# Patient Record
Sex: Female | Born: 1985 | Race: Black or African American | Hispanic: No | Marital: Single | Smoking: Current every day smoker
Health system: Southern US, Community
[De-identification: ages and names within clinical notes are randomized; demographics above are authoritative.]

## PROBLEM LIST (undated history)

## (undated) ENCOUNTER — Emergency Department (HOSPITAL_COMMUNITY): Discharge status: Absent without leave | Payer: Self-pay

---

## 2005-03-23 ENCOUNTER — Emergency Department: Payer: Self-pay | Admitting: Unknown Physician Specialty

## 2005-12-12 ENCOUNTER — Other Ambulatory Visit: Payer: Self-pay

## 2005-12-12 ENCOUNTER — Emergency Department: Payer: Self-pay | Admitting: Emergency Medicine

## 2007-06-21 ENCOUNTER — Emergency Department: Payer: Self-pay | Admitting: General Practice

## 2007-06-25 ENCOUNTER — Emergency Department: Payer: Self-pay | Admitting: Emergency Medicine

## 2007-07-24 ENCOUNTER — Emergency Department: Payer: Self-pay

## 2007-09-15 ENCOUNTER — Emergency Department: Payer: Self-pay | Admitting: Emergency Medicine

## 2007-11-02 ENCOUNTER — Observation Stay: Payer: Self-pay | Admitting: Obstetrics and Gynecology

## 2007-12-10 ENCOUNTER — Observation Stay: Payer: Self-pay

## 2008-01-25 ENCOUNTER — Observation Stay: Payer: Self-pay | Admitting: Obstetrics and Gynecology

## 2008-02-03 ENCOUNTER — Observation Stay: Payer: Self-pay | Admitting: Certified Nurse Midwife

## 2008-02-10 ENCOUNTER — Observation Stay: Payer: Self-pay

## 2008-02-11 ENCOUNTER — Inpatient Hospital Stay: Payer: Self-pay | Admitting: Obstetrics and Gynecology

## 2010-01-23 ENCOUNTER — Emergency Department: Payer: Self-pay | Admitting: Emergency Medicine

## 2010-10-19 ENCOUNTER — Emergency Department: Payer: Self-pay | Admitting: Emergency Medicine

## 2011-06-20 ENCOUNTER — Emergency Department: Payer: Self-pay | Admitting: Emergency Medicine

## 2011-07-15 ENCOUNTER — Emergency Department: Payer: Self-pay | Admitting: Emergency Medicine
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
## 2012-11-08 ENCOUNTER — Emergency Department: Payer: Self-pay | Admitting: Emergency Medicine

## 2012-12-26 LAB — URINALYSIS, COMPLETE
Glucose,UR: NEGATIVE mg/dL (ref 0–75)
Nitrite: NEGATIVE
Ph: 5 (ref 4.5–8.0)
Protein: 100
RBC,UR: 3 /HPF (ref 0–5)
WBC UR: 13 /HPF (ref 0–5)

## 2012-12-26 LAB — COMPREHENSIVE METABOLIC PANEL
Albumin: 4.5 g/dL (ref 3.4–5.0)
Alkaline Phosphatase: 102 U/L (ref 50–136)
Calcium, Total: 9.4 mg/dL (ref 8.5–10.1)
Creatinine: 0.79 mg/dL (ref 0.60–1.30)
EGFR (Non-African Amer.): >60
Glucose: 123 mg/dL — ABNORMAL HIGH (ref 65–99)
SGOT(AST): 17 U/L (ref 15–37)
SGPT (ALT): 16 U/L (ref 12–78)
Sodium: 136 mmol/L (ref 136–145)

## 2012-12-26 LAB — CBC
HCT: 43.7 % (ref 35.0–47.0)
HGB: 14.6 g/dL (ref 12.0–16.0)
MCH: 28.9 pg (ref 26.0–34.0)
RBC: 5.04 10*6/uL (ref 3.80–5.20)

## 2012-12-27 ENCOUNTER — Inpatient Hospital Stay: Payer: Self-pay

## 2012-12-27 LAB — WET PREP, GENITAL

## 2012-12-28 LAB — CBC WITH DIFFERENTIAL/PLATELET
Basophil #: 0 10*3/uL (ref 0.0–0.1)
Basophil %: 0.1 %
Eosinophil #: 0.1 10*3/uL (ref 0.0–0.7)
HGB: 11.7 g/dL — ABNORMAL LOW (ref 12.0–16.0)
Lymphocyte #: 0.6 10*3/uL — ABNORMAL LOW (ref 1.0–3.6)
Lymphocyte %: 8.1 %
MCH: 30 pg (ref 26.0–34.0)
MCHC: 34.3 g/dL (ref 32.0–36.0)
MCV: 87 fL (ref 80–100)
Monocyte #: 0.6 x10 3/mm (ref 0.2–0.9)
Neutrophil #: 6.2 10*3/uL (ref 1.4–6.5)
Neutrophil %: 82.9 %

## 2012-12-28 LAB — POTASSIUM: Potassium: 3 mmol/L — ABNORMAL LOW (ref 3.5–5.1)

## 2013-10-23 ENCOUNTER — Emergency Department: Payer: Self-pay | Admitting: Emergency Medicine

## 2014-03-05 ENCOUNTER — Emergency Department: Payer: Self-pay | Admitting: Emergency Medicine

## 2014-03-05 LAB — URINALYSIS, COMPLETE
BACTERIA: NONE SEEN
Bilirubin,UR: NEGATIVE
Glucose,UR: NEGATIVE mg/dL (ref 0–75)
Nitrite: NEGATIVE
Ph: 5 (ref 4.5–8.0)
Protein: 30
Specific Gravity: 1.025 (ref 1.003–1.030)
WBC UR: 7 /HPF (ref 0–5)

## 2014-03-05 LAB — COMPREHENSIVE METABOLIC PANEL
ANION GAP: 6 — AB (ref 7–16)
AST: 23 U/L (ref 15–37)
Albumin: 4.3 g/dL (ref 3.4–5.0)
Alkaline Phosphatase: 81 U/L
BUN: 8 mg/dL (ref 7–18)
Bilirubin,Total: 0.5 mg/dL (ref 0.2–1.0)
CHLORIDE: 104 mmol/L (ref 98–107)
Calcium, Total: 9.4 mg/dL (ref 8.5–10.1)
Co2: 26 mmol/L (ref 21–32)
Creatinine: 0.72 mg/dL (ref 0.60–1.30)
EGFR (African American): >60
EGFR (Non-African Amer.): >60
Glucose: 86 mg/dL (ref 65–99)
OSMOLALITY: 270 (ref 275–301)
Potassium: 3.6 mmol/L (ref 3.5–5.1)
SGPT (ALT): 39 U/L (ref 12–78)
Sodium: 136 mmol/L (ref 136–145)
TOTAL PROTEIN: 9.1 g/dL — AB (ref 6.4–8.2)

## 2014-03-05 LAB — CBC WITH DIFFERENTIAL/PLATELET
Basophil #: 0 10*3/uL (ref 0.0–0.1)
Basophil %: 0.6 %
EOS PCT: 0.5 %
Eosinophil #: 0 10*3/uL (ref 0.0–0.7)
HCT: 45.8 % (ref 35.0–47.0)
HGB: 15.3 g/dL (ref 12.0–16.0)
LYMPHS ABS: 1.1 10*3/uL (ref 1.0–3.6)
LYMPHS PCT: 17.4 %
MCH: 29.3 pg (ref 26.0–34.0)
MCHC: 33.5 g/dL (ref 32.0–36.0)
MCV: 87 fL (ref 80–100)
MONO ABS: 0.2 x10 3/mm (ref 0.2–0.9)
MONOS PCT: 3.2 %
NEUTROS ABS: 5.1 10*3/uL (ref 1.4–6.5)
NEUTROS PCT: 78.3 %
PLATELETS: 175 10*3/uL (ref 150–440)
RBC: 5.24 10*6/uL — AB (ref 3.80–5.20)
RDW: 13.6 % (ref 11.5–14.5)
WBC: 6.5 10*3/uL (ref 3.6–11.0)

## 2014-07-16 ENCOUNTER — Emergency Department: Payer: Self-pay | Admitting: Internal Medicine

## 2014-07-16 LAB — COMPREHENSIVE METABOLIC PANEL
Albumin: 3.8 g/dL (ref 3.4–5.0)
Alkaline Phosphatase: 52 U/L
Anion Gap: 7 (ref 7–16)
BUN: 7 mg/dL (ref 7–18)
Bilirubin,Total: 0.3 mg/dL (ref 0.2–1.0)
CHLORIDE: 105 mmol/L (ref 98–107)
Calcium, Total: 8.4 mg/dL — ABNORMAL LOW (ref 8.5–10.1)
Co2: 26 mmol/L (ref 21–32)
Creatinine: 0.85 mg/dL (ref 0.60–1.30)
EGFR (African American): >60
GLUCOSE: 108 mg/dL — AB (ref 65–99)
Osmolality: 274 (ref 275–301)
Potassium: 3.2 mmol/L — ABNORMAL LOW (ref 3.5–5.1)
SGOT(AST): 21 U/L (ref 15–37)
SGPT (ALT): 17 U/L (ref 12–78)
Sodium: 138 mmol/L (ref 136–145)
Total Protein: 7.9 g/dL (ref 6.4–8.2)

## 2014-07-16 LAB — CBC WITH DIFFERENTIAL/PLATELET
BASOS ABS: 0.1 10*3/uL (ref 0.0–0.1)
Basophil %: 0.8 %
EOS ABS: 0.1 10*3/uL (ref 0.0–0.7)
Eosinophil %: 0.9 %
HCT: 39.2 % (ref 35.0–47.0)
HGB: 12.9 g/dL (ref 12.0–16.0)
LYMPHS ABS: 2.1 10*3/uL (ref 1.0–3.6)
Lymphocyte %: 24.3 %
MCH: 29.1 pg (ref 26.0–34.0)
MCHC: 32.9 g/dL (ref 32.0–36.0)
MCV: 88 fL (ref 80–100)
MONOS PCT: 4.7 %
Monocyte #: 0.4 x10 3/mm (ref 0.2–0.9)
NEUTROS PCT: 69.3 %
Neutrophil #: 5.9 10*3/uL (ref 1.4–6.5)
Platelet: 212 10*3/uL (ref 150–440)
RBC: 4.44 10*6/uL (ref 3.80–5.20)
RDW: 13.4 % (ref 11.5–14.5)
WBC: 8.5 10*3/uL (ref 3.6–11.0)

## 2014-07-16 LAB — LIPASE, BLOOD: Lipase: 330 U/L (ref 73–393)

## 2014-07-16 LAB — URINALYSIS, COMPLETE
BILIRUBIN, UR: NEGATIVE
Bacteria: NONE SEEN
Blood: NEGATIVE
Glucose,UR: NEGATIVE mg/dL (ref 0–75)
Nitrite: NEGATIVE
Ph: 5 (ref 4.5–8.0)
Protein: NEGATIVE
RBC,UR: 2 /HPF (ref 0–5)
Specific Gravity: 1.014 (ref 1.003–1.030)
Squamous Epithelial: <1

## 2014-07-16 LAB — HCG, QUANTITATIVE, PREGNANCY: Beta Hcg, Quant.: <1 m[IU]/mL — ABNORMAL LOW

## 2014-11-15 IMAGING — CR CERVICAL SPINE - 2-3 VIEW
1 series · 6 of 6 positions shown · non-contrast
Comparison: none

REASON FOR EXAM: fall, neck pain
COMMENTS:

PROCEDURE:     DXR - DXR C- SPINE AP AND LATERAL  - October 23, 2013  [DATE]
RESULT:     Technique: Cervical spine series was obtained.

[Series 1: w cervical spine lat · 0.14mm/px · 6 of 6 slices shown]
[im 1/6]
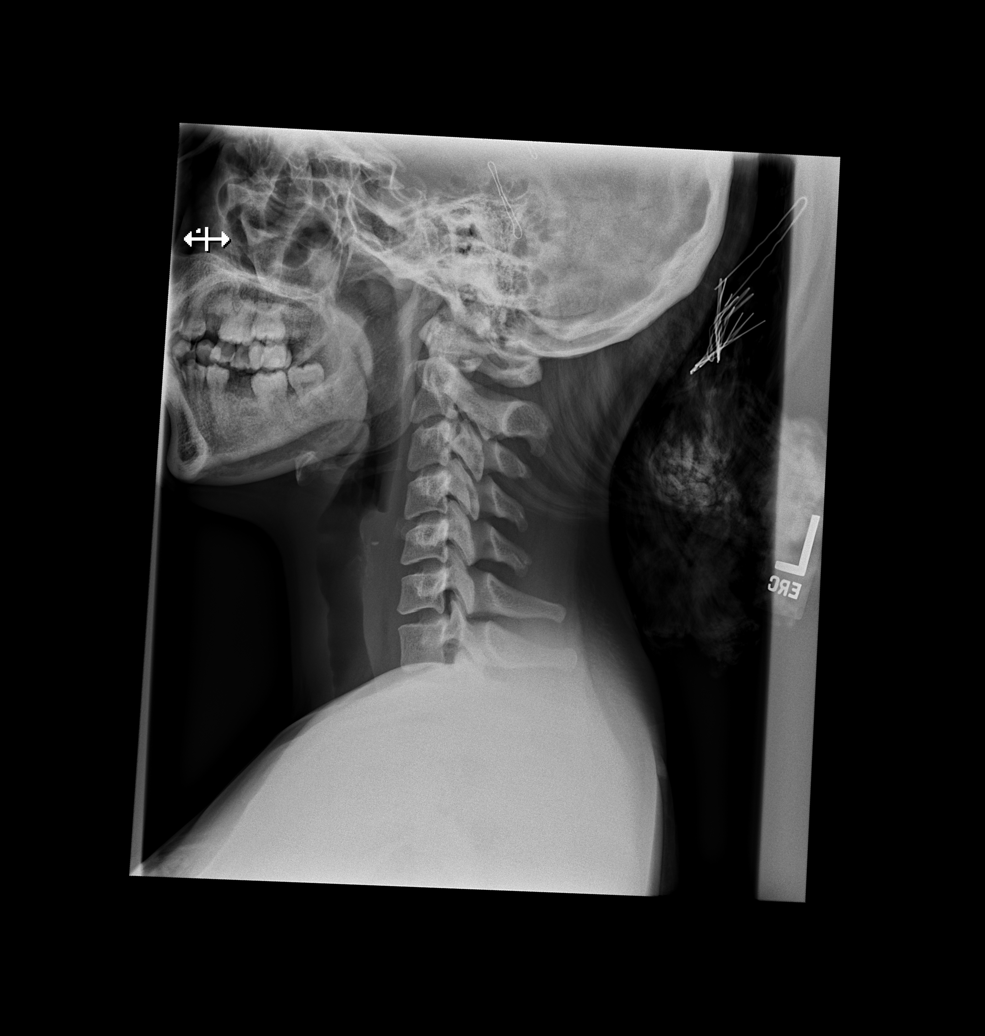
[im 2/6]
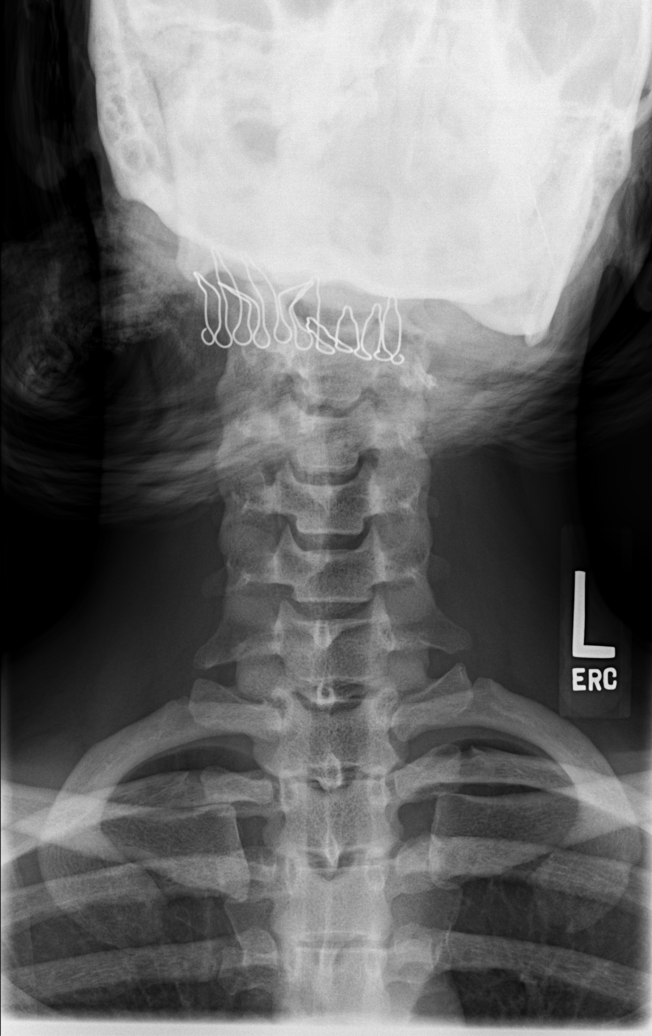
[im 3/6]
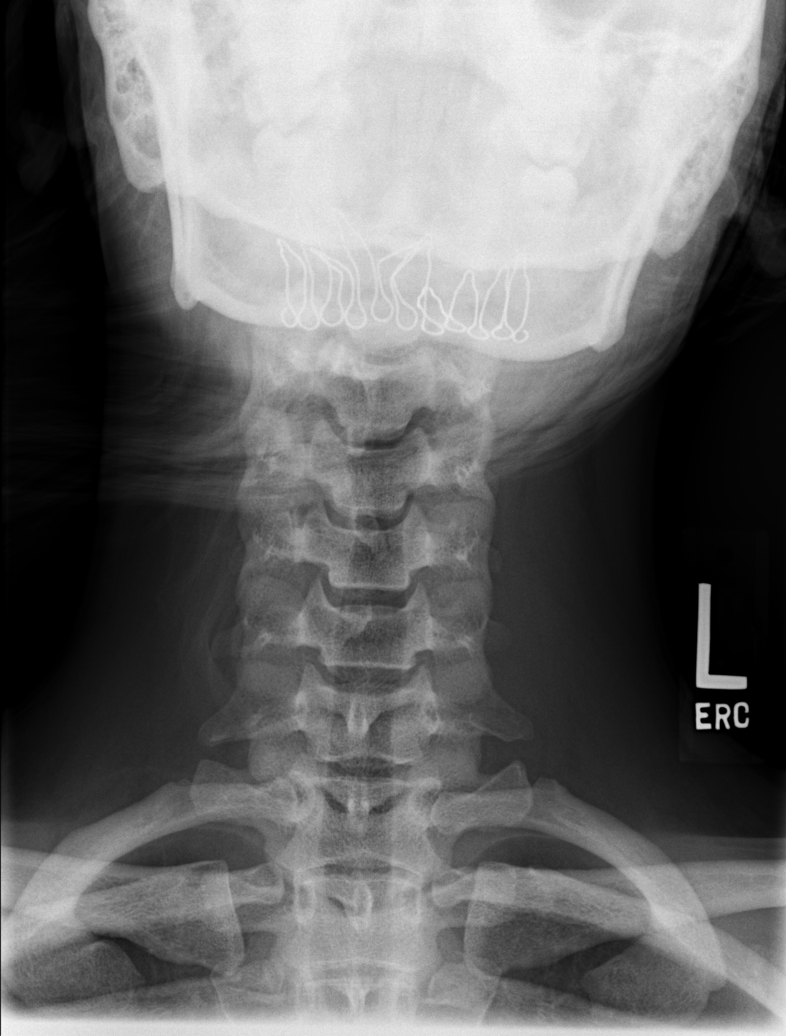
[im 4/6]
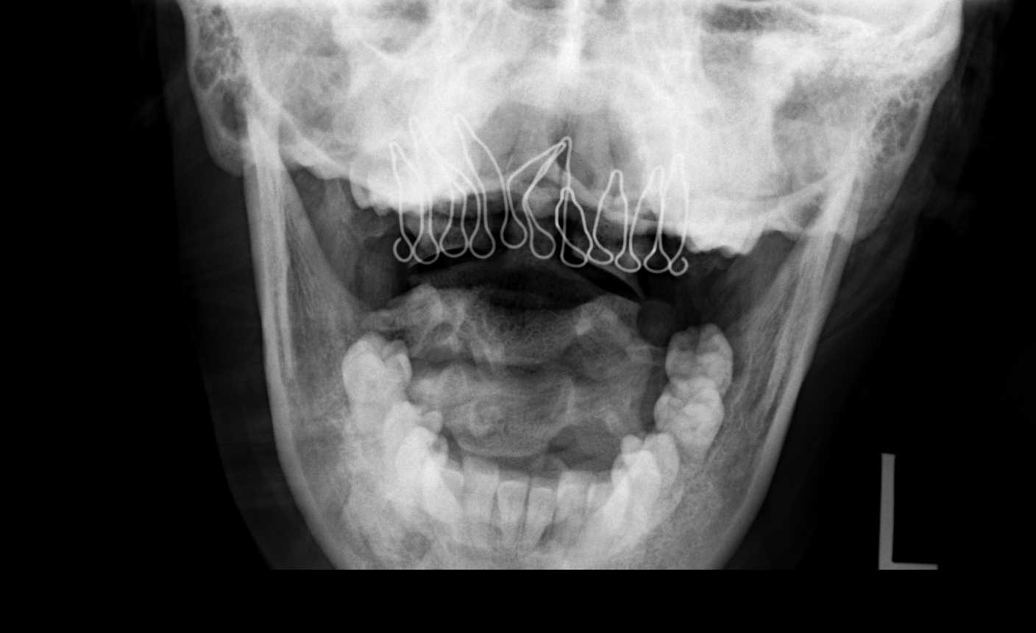
[im 5/6]
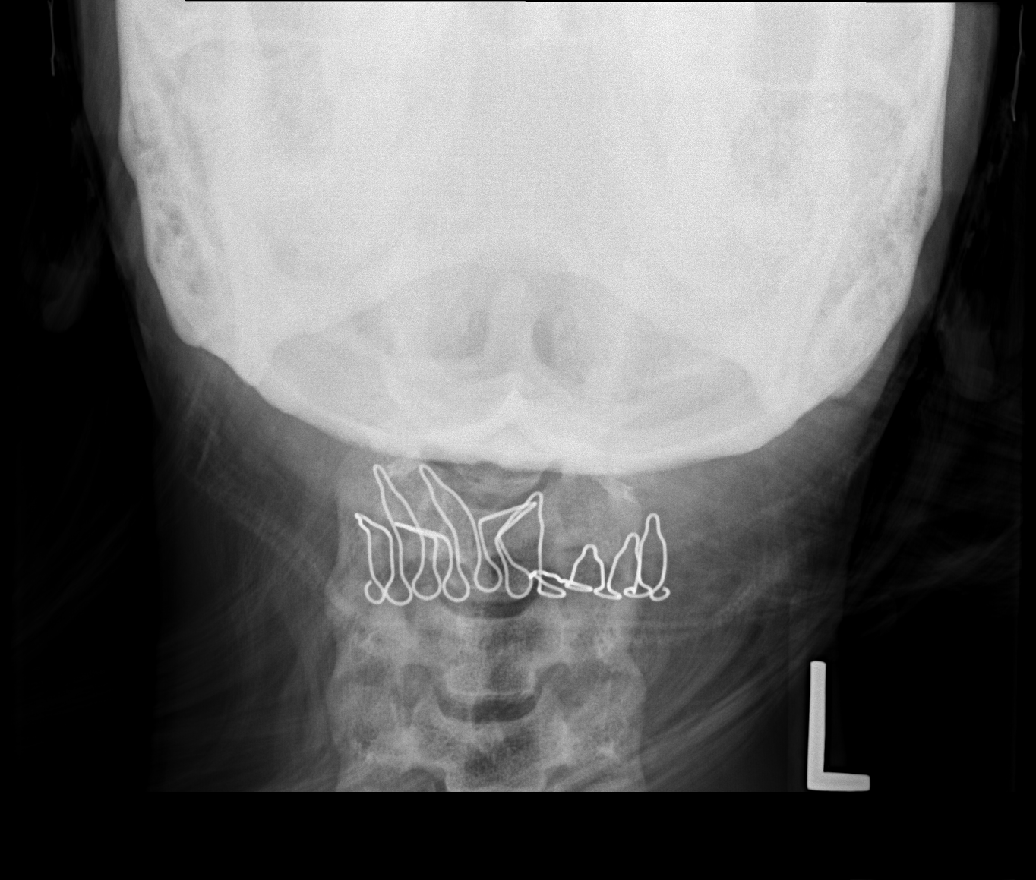
[im 6/6]
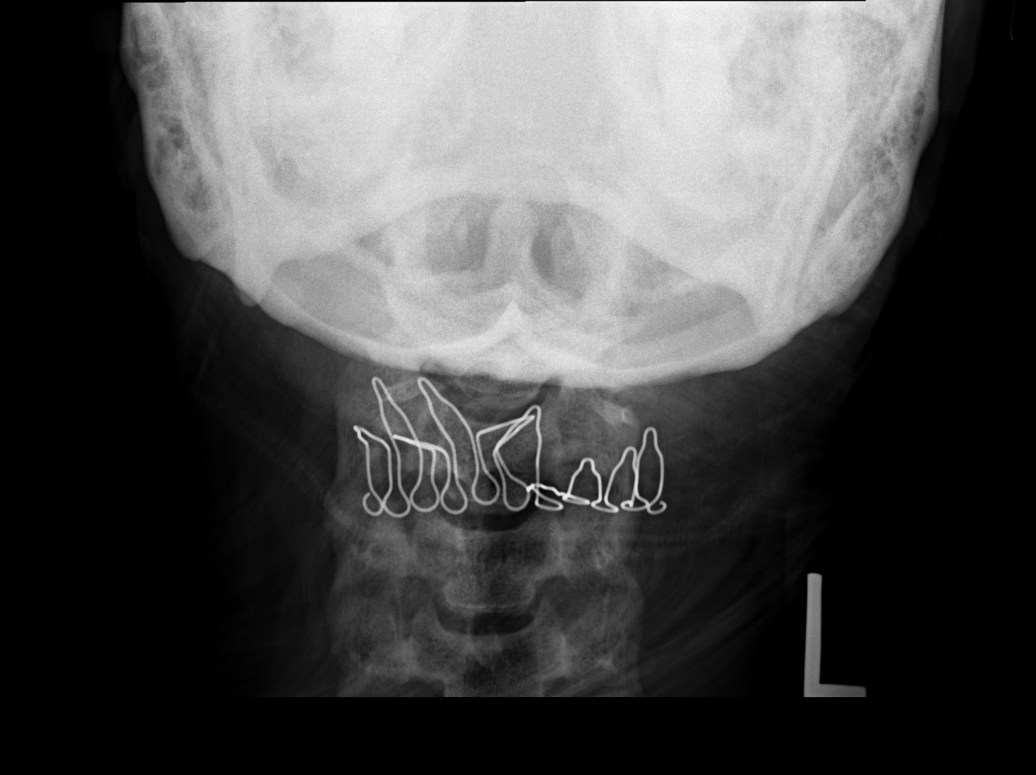

[6 of 6 positions shown; findings below may reference images not displayed]

FINDINGS: There is no evidence of fracture, dislocation nor malalignment.
There is no evidence of prevertebral soft tissue swelling.
IMPRESSION: No evidence of acute osseous abnormalities.
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
## 2023-10-12 ENCOUNTER — Other Ambulatory Visit: Payer: Self-pay

## 2023-10-12 ENCOUNTER — Emergency Department
Admission: EM | Admit: 2023-10-12 | Discharge: 2023-10-12 | Disposition: A | Discharge status: Home / Self Care | Payer: 59 | Attending: Emergency Medicine | Admitting: Emergency Medicine

## 2023-10-12 DIAGNOSIS — I10 Essential (primary) hypertension: Secondary | ICD-10-CM | POA: Insufficient documentation

## 2023-10-12 DIAGNOSIS — R1110 Vomiting, unspecified: Secondary | ICD-10-CM | POA: Insufficient documentation

## 2023-10-12 DIAGNOSIS — G43809 Other migraine, not intractable, without status migrainosus: Secondary | ICD-10-CM | POA: Insufficient documentation

## 2023-10-12 DIAGNOSIS — Z1152 Encounter for screening for COVID-19: Secondary | ICD-10-CM | POA: Diagnosis not present

## 2023-10-12 DIAGNOSIS — R519 Headache, unspecified: Secondary | ICD-10-CM | POA: Diagnosis present

## 2023-10-12 LAB — COMPREHENSIVE METABOLIC PANEL
ALT: 36 U/L (ref 0–44)
AST: 32 U/L (ref 15–41)
Albumin: 4.2 g/dL (ref 3.5–5.0)
Alkaline Phosphatase: 53 U/L (ref 38–126)
Anion gap: 12 (ref 5–15)
BUN: 9 mg/dL (ref 6–20)
CO2: 22 mmol/L (ref 22–32)
Calcium: 8.9 mg/dL (ref 8.9–10.3)
Chloride: 103 mmol/L (ref 98–111)
Creatinine, Ser: 0.7 mg/dL (ref 0.44–1.00)
GFR, Estimated: >60 mL/min (ref >60)
Glucose, Bld: 101 mg/dL — ABNORMAL HIGH (ref 70–99)
Potassium: 3.5 mmol/L (ref 3.5–5.1)
Sodium: 137 mmol/L (ref 135–145)
Total Bilirubin: 0.6 mg/dL (ref 0.3–1.2)
Total Protein: 7.9 g/dL (ref 6.5–8.1)

## 2023-10-12 LAB — CBC WITH DIFFERENTIAL/PLATELET
Abs Immature Granulocytes: 0.02 10*3/uL (ref 0.00–0.07)
Basophils Absolute: 0 10*3/uL (ref 0.0–0.1)
Basophils Relative: 0 %
Eosinophils Absolute: 0.1 10*3/uL (ref 0.0–0.5)
Eosinophils Relative: 1 %
HCT: 41.9 % (ref 36.0–46.0)
Hemoglobin: 13.9 g/dL (ref 12.0–15.0)
Immature Granulocytes: 0 %
Lymphocytes Relative: 23 %
Lymphs Abs: 2 10*3/uL (ref 0.7–4.0)
MCH: 29.2 pg (ref 26.0–34.0)
MCHC: 33.2 g/dL (ref 30.0–36.0)
MCV: 88 fL (ref 80.0–100.0)
Monocytes Absolute: 0.4 10*3/uL (ref 0.1–1.0)
Monocytes Relative: 5 %
Neutro Abs: 6.4 10*3/uL (ref 1.7–7.7)
Neutrophils Relative %: 71 %
Platelets: 186 10*3/uL (ref 150–400)
RBC: 4.76 MIL/uL (ref 3.87–5.11)
RDW: 13.2 % (ref 11.5–15.5)
WBC: 9 10*3/uL (ref 4.0–10.5)
nRBC: 0 % (ref 0.0–0.2)

## 2023-10-12 LAB — RESP PANEL BY RT-PCR (RSV, FLU A&B, COVID)  RVPGX2
Influenza A by PCR: NEGATIVE
Influenza B by PCR: NEGATIVE
Resp Syncytial Virus by PCR: NEGATIVE
SARS Coronavirus 2 by RT PCR: NEGATIVE

## 2023-10-12 MED ORDER — BUTALBITAL-APAP-CAFFEINE 50-325-40 MG PO TABS: 1.0000 | ORAL_TABLET | Freq: Four times a day (QID) | ORAL | 0 refills | Status: DC | PRN

## 2023-10-12 MED ORDER — ACETAMINOPHEN 325 MG PO TABS
650.0000 mg | ORAL_TABLET | Freq: Once | ORAL | Status: AC
  Administered 2023-10-12: 650 mg via ORAL
  Filled 2023-10-12: qty 2

## 2023-10-12 MED ORDER — AMLODIPINE BESYLATE 2.5 MG PO TABS: 2.5000 mg | ORAL_TABLET | Freq: Every day | ORAL | 1 refills | Status: DC

## 2023-10-12 NOTE — ED Provider Notes (Signed)
[facility] Provider Note    Event Date/Time   First MD Initiated Contact with Patient 10/12/23 2156     (approximate)   History   Headache and Emesis   HPI  Haley Knight is a 37 y.o. female here with headache.  The patient states that over the last day, she has had aching, throbbing, severe headache.  Actually started mildly yesterday but has been more severe throughout the day today.  She had associated mild light sensitivity.  She has a history of chronic migraines that feels somewhat similar.  She was given ibuprofen and she fell asleep in the waiting room, states her headache is now largely resolved.  She does state that she has noticed increasing blood pressures and has been elevated at recent urgent care checks.  She was previously on amlodipine for this and is concerned about this contributing to her headaches.  She switched PCPs and they did not fill her medication.  Denies any focal numbness or weakness.  No fevers or chills.     Physical Exam   Triage Vital Signs: ED Triage Vitals  Encounter Vitals Group     BP 10/12/23 1938 (!) 142/102     Systolic BP Percentile --      Diastolic BP Percentile --      Pulse Rate 10/12/23 1938 85     Resp 10/12/23 1938 18     Temp 10/12/23 1938 98.3 F (36.8 C)     Temp src --      SpO2 10/12/23 1938 97 %     Weight 10/12/23 1937 162 lb (73.5 kg)     Height 10/12/23 1937 5\' 4"  (1.626 m)     Head Circumference --      Peak Flow --      Pain Score 10/12/23 1937 8     Pain Loc --      Pain Education --      Exclude from Growth Chart --     Most recent vital signs: Vitals:   10/12/23 2148 10/12/23 2149  BP: (!) 122/95   Pulse:  69  Resp:    Temp:    SpO2:  99%     General: Awake, no distress.  CV:  Good peripheral perfusion.  Regular rate and rhythm.  No murmurs. Resp:  Normal work of breathing.  Lungs clear to auscultation bilaterally. Abd:  No distention.  No tenderness.  Negative or  rebound. Other:  Cranial nerves II through XII intact.  Strength out of 5 bilateral upper and lower extremities.  Normal station to light touch.  No dysmetria.  Normal gait.   ED Results / Procedures / Treatments   Labs (all labs ordered are listed, but only abnormal results are displayed) Labs Reviewed  COMPREHENSIVE METABOLIC PANEL - Abnormal; Notable for the following components:      Result Value   Glucose, Bld 101 (*)    All other components within normal limits  RESP PANEL BY RT-PCR (RSV, FLU A&B, COVID)  [id number]  CBC WITH DIFFERENTIAL/PLATELET     EKG    RADIOLOGY    I also independently reviewed and agree with radiologist interpretations.   PROCEDURES:  Critical Care performed: No   MEDICATIONS ORDERED IN ED: Medications  acetaminophen (TYLENOL) tablet 650 mg (650 mg Oral Given 10/12/23 1943)     IMPRESSION / MDM / ASSESSMENT AND PLAN / ED COURSE  I reviewed the triage vital signs and the nursing notes.  Differential diagnosis includes, but is not limited to, migraine headache, chronic tension type headache, hypertension related symptoms.  Unlikely ICH, CVA, mass/lesion.  Patient's presentation is most consistent with acute presentation with potential threat to life or bodily function.  The patient is on the cardiac monitor to evaluate for evidence of arrhythmia and/or significant heart rate changes   Very well-appearing 37 year old female here with headache, now resolved.  No focal neurological deficits.  The patient has a history of migraines with very frequent headaches, suspect this is etiology.  She does have intermittent elevated blood pressures here and states she has been running high at home.  She has no symptoms to suggest intracranial hemorrhage and her headache has resolved.  I do think it is reasonable to restart her amlodipine given that she has tolerated this well in the past, and will have her follow-up with her  PCP.  No other emergent pathology.  No signs of meningitis or encephalitis.  No focal neurological deficits and she has known history of headaches, do not feel CT imaging is indicated at this time.   FINAL CLINICAL IMPRESSION(S) / ED DIAGNOSES   Final diagnoses:  Primary hypertension  Other migraine without status migrainosus, not intractable     Rx / DC Orders   ED Discharge Orders          Ordered    amLODipine (NORVASC) 2.5 MG tablet  Daily        10/12/23 2239    butalbital-acetaminophen-caffeine (FIORICET) 50-325-40 MG tablet  Every 6 hours PRN        10/12/23 2239             Note:  This document was prepared using Dragon voice recognition software and may include unintentional dictation errors.   Shaune Pollack, MD 10/12/23 2252

## 2023-10-12 NOTE — ED Triage Notes (Addendum)
Pt reports headache x2days with emesis. Pt denies fevers cough congestion or abdominal pain. Pts speech is clear, no neuro deficits at this time. Denies dizziness. Pt states she was told previously she had HTN but was never given rx to treat it.

## 2023-10-13 ENCOUNTER — Telehealth: Payer: Self-pay | Admitting: Emergency Medicine

## 2023-10-13 MED ORDER — AMLODIPINE BESYLATE 2.5 MG PO TABS: 2.5000 mg | ORAL_TABLET | Freq: Every day | ORAL | 1 refills | Status: AC

## 2023-10-13 MED ORDER — BUTALBITAL-APAP-CAFFEINE 50-325-40 MG PO TABS: 1.0000 | ORAL_TABLET | Freq: Four times a day (QID) | ORAL | 0 refills | Status: DC | PRN

## 2023-10-13 NOTE — Telephone Encounter (Signed)
[facility] states [facility] did not receive the prescriptions.  I [facility] [place] for the patient.

## 2024-06-23 ENCOUNTER — Emergency Department

## 2024-06-23 ENCOUNTER — Emergency Department
Admission: EM | Admit: 2024-06-23 | Discharge: 2024-06-23 | Disposition: A | Discharge status: Home / Self Care | Attending: Emergency Medicine | Admitting: Emergency Medicine

## 2024-06-23 ENCOUNTER — Encounter: Payer: Self-pay | Admitting: Emergency Medicine

## 2024-06-23 ENCOUNTER — Other Ambulatory Visit: Payer: Self-pay

## 2024-06-23 DIAGNOSIS — S161XXA Strain of muscle, fascia and tendon at neck level, initial encounter: Secondary | ICD-10-CM | POA: Insufficient documentation

## 2024-06-23 DIAGNOSIS — Y92524 Gas station as the place of occurrence of the external cause: Secondary | ICD-10-CM | POA: Insufficient documentation

## 2024-06-23 DIAGNOSIS — M542 Cervicalgia: Secondary | ICD-10-CM | POA: Diagnosis present

## 2024-06-23 LAB — POC URINE PREG, ED: Preg Test, Ur: NEGATIVE

## 2024-06-23 MED ORDER — IBUPROFEN 600 MG PO TABS
600.0000 mg | ORAL_TABLET | Freq: Once | ORAL | Status: AC
  Administered 2024-06-23: 600 mg via ORAL
  Filled 2024-06-23: qty 1
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
## 2024-06-23 NOTE — Discharge Instructions (Signed)
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
